# Patient Record
Sex: Female | Born: 1950 | Race: White | Hispanic: No | Marital: Married | Smoking: Never smoker
Health system: Southern US, Community
[De-identification: ages and names within clinical notes are randomized; demographics above are authoritative.]

## PROBLEM LIST (undated history)

## (undated) DIAGNOSIS — C50919 Malignant neoplasm of unspecified site of unspecified female breast: Secondary | ICD-10-CM

---

## 2017-11-07 ENCOUNTER — Other Ambulatory Visit: Payer: Self-pay

## 2017-11-07 ENCOUNTER — Encounter (HOSPITAL_COMMUNITY): Payer: Self-pay

## 2017-11-07 DIAGNOSIS — S0990XA Unspecified injury of head, initial encounter: Secondary | ICD-10-CM | POA: Diagnosis present

## 2017-11-07 DIAGNOSIS — Y999 Unspecified external cause status: Secondary | ICD-10-CM | POA: Insufficient documentation

## 2017-11-07 DIAGNOSIS — S0083XA Contusion of other part of head, initial encounter: Secondary | ICD-10-CM | POA: Insufficient documentation

## 2017-11-07 DIAGNOSIS — Y9389 Activity, other specified: Secondary | ICD-10-CM | POA: Insufficient documentation

## 2017-11-07 DIAGNOSIS — Z853 Personal history of malignant neoplasm of breast: Secondary | ICD-10-CM | POA: Insufficient documentation

## 2017-11-07 DIAGNOSIS — Y929 Unspecified place or not applicable: Secondary | ICD-10-CM | POA: Diagnosis not present

## 2017-11-07 DIAGNOSIS — Z79899 Other long term (current) drug therapy: Secondary | ICD-10-CM | POA: Diagnosis not present

## 2017-11-07 DIAGNOSIS — W101XXA Fall (on)(from) sidewalk curb, initial encounter: Secondary | ICD-10-CM | POA: Insufficient documentation

## 2017-11-07 DIAGNOSIS — Z7982 Long term (current) use of aspirin: Secondary | ICD-10-CM | POA: Diagnosis not present

## 2017-11-07 DIAGNOSIS — Z23 Encounter for immunization: Secondary | ICD-10-CM | POA: Diagnosis not present

## 2017-11-07 NOTE — ED Triage Notes (Addendum)
Fell PTA to ER no LOC hit chin and nose and bil knees and left hand voiced and bit inside of lower lip bleeding controlled. Neck pain voiced.

## 2017-11-08 ENCOUNTER — Emergency Department (HOSPITAL_COMMUNITY)
Admission: EM | Admit: 2017-11-08 | Discharge: 2017-11-08 | Disposition: A | Discharge status: Home / Self Care | Payer: Medicare Other | Attending: Emergency Medicine | Admitting: Emergency Medicine

## 2017-11-08 ENCOUNTER — Emergency Department (HOSPITAL_COMMUNITY): Payer: Medicare Other

## 2017-11-08 DIAGNOSIS — S0083XA Contusion of other part of head, initial encounter: Secondary | ICD-10-CM | POA: Diagnosis not present

## 2017-11-08 DIAGNOSIS — W19XXXA Unspecified fall, initial encounter: Secondary | ICD-10-CM

## 2017-11-08 DIAGNOSIS — T07XXXA Unspecified multiple injuries, initial encounter: Secondary | ICD-10-CM

## 2017-11-08 HISTORY — DX: Malignant neoplasm of unspecified site of unspecified female breast: C50.919

## 2017-11-08 MED ORDER — BACITRACIN ZINC 500 UNIT/GM EX OINT
TOPICAL_OINTMENT | Freq: Two times a day (BID) | CUTANEOUS | Status: DC
  Administered 2017-11-08: 1 via TOPICAL
  Filled 2017-11-08: qty 2.7

## 2017-11-08 MED ORDER — KETOROLAC TROMETHAMINE 30 MG/ML IJ SOLN
30.0000 mg | Freq: Once | INTRAMUSCULAR | Status: AC
  Administered 2017-11-08: 30 mg via INTRAMUSCULAR
  Filled 2017-11-08: qty 1

## 2017-11-08 MED ORDER — DICLOFENAC SODIUM ER 100 MG PO TB24: 100.0000 mg | ORAL_TABLET | Freq: Every day | ORAL | 0 refills | Status: AC

## 2017-11-08 MED ORDER — TETANUS-DIPHTH-ACELL PERTUSSIS 5-2.5-18.5 LF-MCG/0.5 IM SUSP
0.5000 mL | Freq: Once | INTRAMUSCULAR | Status: AC
  Administered 2017-11-08: 0.5 mL via INTRAMUSCULAR
  Filled 2017-11-08: qty 0.5

## 2017-11-08 MED ORDER — BACITRACIN ZINC 500 UNIT/GM EX OINT
TOPICAL_OINTMENT | Freq: Once | CUTANEOUS | Status: AC
  Administered 2017-11-08: 1 via TOPICAL

## 2017-11-08 NOTE — ED Provider Notes (Addendum)
[facility] DEPT Provider Note   CSN: [id number] Arrival date & time: 11/07/17  2301     History   Chief Complaint Chief Complaint  Patient presents with  . Fall    HPI Peggy Charles is a 67 y.o. female.  The history is provided by the patient.  Fall  This is a new problem. The current episode started 3 to 5 hours ago. The problem occurs constantly. The problem has not changed since onset.Pertinent negatives include no chest pain, no abdominal pain, no headaches and no shortness of breath. Nothing relieves the symptoms. She has tried nothing for the symptoms. The treatment provided no relief.  Going up curb and inverted foot and fell hitting knees on ground and nose.  No LOC.  No n/v.    Past Medical History:  Diagnosis Date  . Breast cancer ([place])     There are no active problems to display for this patient.   History reviewed. No pertinent surgical history.   OB History   None      Home Medications    Prior to Admission medications   Medication Sig Start Date End Date Taking? Authorizing Provider  anastrozole (ARIMIDEX) 1 MG tablet Take 1 mg by mouth daily. 09/14/17  Yes [provider]  aspirin EC 81 MG tablet Take 81 mg by mouth daily.   Yes [provider]  azithromycin (ZITHROMAX) 250 MG tablet Take 250-500 mg by mouth See admin instructions. 650 mg now, 250 mg x 4 days 11/03/17  Yes [provider]  Cholecalciferol (VITAMIN D) 2000 units tablet Take 2,000 Units by mouth daily.   Yes [provider]  Omega-3 Fatty Acids (FISH OIL) 1000 MG CAPS Take 1,000 mg by mouth daily.   Yes [provider]  vitamin E 400 UNIT capsule Take 400 Units by mouth daily.   Yes [provider]    Family History History reviewed. No pertinent family history.  Social History Social History   Tobacco Use  . Smoking status: Never Smoker  . Smokeless tobacco: Never Used  Substance Use Topics  .  Alcohol use: Never    Frequency: Never  . Drug use: Never     Allergies   Oxycodone and Penicillins   Review of Systems Review of Systems  HENT: Positive for facial swelling.   Respiratory: Negative for shortness of breath.   Cardiovascular: Negative for chest pain.  Gastrointestinal: Negative for abdominal pain.  Musculoskeletal: Negative for back pain, gait problem, neck pain and neck stiffness.  Neurological: Negative for dizziness, tremors, seizures, syncope, facial asymmetry, speech difficulty, weakness, light-headedness, numbness and headaches.  Hematological: Does not bruise/bleed easily.  All other systems reviewed and are negative.    Physical Exam Updated Vital Signs BP (!) 168/75 (BP Location: Right Arm)   Pulse 77   Temp 98.8 F (37.1 C) (Oral)   Resp 16   Ht _0  (1.702 m)   Wt 77.1 kg (170 lb)   SpO2 99%   BMI 26.63 kg/m   Physical Exam  Constitutional: She is oriented to person, place, and time. She appears well-developed and well-nourished.  HENT:  Head: Normocephalic.    Right Ear: No mastoid tenderness. No hemotympanum.  Left Ear: No mastoid tenderness. No hemotympanum.  Mouth/Throat: No oropharyngeal exudate.    Eyes: Pupils are equal, round, and reactive to light. Conjunctivae and EOM are normal.  Neck: Normal range of motion. Neck supple. No tracheal deviation present.  Cardiovascular: Normal rate,  regular rhythm, normal heart sounds and intact distal pulses.  Pulmonary/Chest: Effort normal and breath sounds normal. No stridor. She has no wheezes. She has no rales.  Abdominal: Soft. Bowel sounds are normal. She exhibits no mass. There is no tenderness. There is no rebound and no guarding.  Musculoskeletal: Normal range of motion.       Right shoulder: Normal.       Left shoulder: Normal.       Left elbow: Normal.       Right wrist: Normal.       Left wrist: Normal.       Right knee: Normal.       Left knee: Normal.       Right ankle:  Normal. Achilles tendon normal.       Left ankle: Normal. Achilles tendon normal.       Cervical back: Normal.       Thoracic back: Normal.       Lumbar back: Normal.       Left forearm: Normal.       Left hand: She exhibits normal range of motion, normal capillary refill, no deformity and no laceration. Normal sensation noted. Normal strength noted.       Hands: Neurological: She is alert and oriented to person, place, and time. She displays normal reflexes. Coordination normal.  Skin: Skin is warm and dry. Capillary refill takes less than 2 seconds.     Psychiatric: She has a normal mood and affect.     ED Treatments / Results  Labs (all labs ordered are listed, but only abnormal results are displayed) Labs Reviewed - No data to display  EKG None  Radiology Dg Wrist Complete Left  Result Date: 11/08/2017 CLINICAL DATA:  Left wrist pain after fall. EXAM: LEFT WRIST - COMPLETE 3+ VIEW COMPARISON:  None. FINDINGS: There is no evidence of fracture or dislocation. Small sclerotic focus involving the styloid of the radius compatible with a bone island. There is no evidence of arthropathy or other focal bone abnormality. Soft tissues are unremarkable. IMPRESSION: No acute fracture or malalignment of the left wrist. Electronically Signed   By: Ashley Royalty M.D.   On: 11/08/2017 02:03   Ct Head Wo Contrast  Result Date: 11/08/2017 CLINICAL DATA:  67 y/o  F; fall with injury to the chin and nose. EXAM: CT HEAD WITHOUT CONTRAST CT MAXILLOFACIAL WITHOUT CONTRAST CT CERVICAL SPINE WITHOUT CONTRAST TECHNIQUE: Multidetector CT imaging of the head, cervical spine, and maxillofacial structures were performed using the standard protocol without intravenous contrast. Multiplanar CT image reconstructions of the cervical spine and maxillofacial structures were also generated. COMPARISON:  None. FINDINGS: CT HEAD FINDINGS Brain: No evidence of acute infarction, hemorrhage, hydrocephalus, extra-axial  collection or mass lesion/mass effect. Vascular: No hyperdense vessel or unexpected calcification. Skull: Normal. Negative for fracture or focal lesion. Other: None. CT MAXILLOFACIAL FINDINGS Osseous: No fracture or mandibular dislocation. No destructive process. Orbits: Negative. No traumatic or inflammatory finding. Sinuses: Clear. Soft tissues: Mild soft tissue swelling overlying the chin of the nose. CT CERVICAL SPINE FINDINGS Alignment: Straightening of cervical lordosis without listhesis. Skull base and vertebrae: No acute fracture. No primary bone lesion or focal pathologic process. Soft tissues and spinal canal: No prevertebral fluid or swelling. No visible canal hematoma. Disc levels: Moderate cervical spondylosis with multilevel disc and facet degenerative changes. No high-grade bony canal stenosis. Upper chest: Negative. Other: Negative. IMPRESSION: 1. No acute intracranial abnormality or calvarial fracture. 2. No acute facial fracture  or mandibular dislocation. 3. Soft tissue swelling overlying the chin and nose. 4. No acute fracture or dislocation of the cervical spine. 5. Moderate cervical spondylosis. No high-grade bony canal stenosis. Electronically Signed   By: Kristine Garbe M.D.   On: 11/08/2017 02:27   Ct Cervical Spine Wo Contrast  Result Date: 11/08/2017 CLINICAL DATA:  67 y/o  F; fall with injury to the chin and nose. EXAM: CT HEAD WITHOUT CONTRAST CT MAXILLOFACIAL WITHOUT CONTRAST CT CERVICAL SPINE WITHOUT CONTRAST TECHNIQUE: Multidetector CT imaging of the head, cervical spine, and maxillofacial structures were performed using the standard protocol without intravenous contrast. Multiplanar CT image reconstructions of the cervical spine and maxillofacial structures were also generated. COMPARISON:  None. FINDINGS: CT HEAD FINDINGS Brain: No evidence of acute infarction, hemorrhage, hydrocephalus, extra-axial collection or mass lesion/mass effect. Vascular: No hyperdense vessel or  unexpected calcification. Skull: Normal. Negative for fracture or focal lesion. Other: None. CT MAXILLOFACIAL FINDINGS Osseous: No fracture or mandibular dislocation. No destructive process. Orbits: Negative. No traumatic or inflammatory finding. Sinuses: Clear. Soft tissues: Mild soft tissue swelling overlying the chin of the nose. CT CERVICAL SPINE FINDINGS Alignment: Straightening of cervical lordosis without listhesis. Skull base and vertebrae: No acute fracture. No primary bone lesion or focal pathologic process. Soft tissues and spinal canal: No prevertebral fluid or swelling. No visible canal hematoma. Disc levels: Moderate cervical spondylosis with multilevel disc and facet degenerative changes. No high-grade bony canal stenosis. Upper chest: Negative. Other: Negative. IMPRESSION: 1. No acute intracranial abnormality or calvarial fracture. 2. No acute facial fracture or mandibular dislocation. 3. Soft tissue swelling overlying the chin and nose. 4. No acute fracture or dislocation of the cervical spine. 5. Moderate cervical spondylosis. No high-grade bony canal stenosis. Electronically Signed   By: Kristine Garbe M.D.   On: 11/08/2017 02:27   Dg Hand Complete Left  Result Date: 11/08/2017 CLINICAL DATA:  Left hand pain after slip and fall. EXAM: LEFT HAND - COMPLETE 3+ VIEW COMPARISON:  None. FINDINGS: There is no evidence of fracture or dislocation. Mild osteoarthritic joint space narrowing of the PIP and DIP joints of the fifth digit. Carpal rows are intact. Distal radius and ulna are unremarkable. Soft tissues are unremarkable. IMPRESSION: No acute fracture or malalignment. 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 Electronically Signed   By: Ashley Royalty M.D.   On: 11/08/2017 01:57   Ct Maxillofacial Wo Contrast  Result Date: 11/08/2017 CLINICAL DATA:  67 y/o  F; fall with injury to the chin and nose. EXAM: CT HEAD WITHOUT CONTRAST CT MAXILLOFACIAL WITHOUT  CONTRAST CT CERVICAL SPINE WITHOUT CONTRAST TECHNIQUE: Multidetector CT imaging of the head, cervical spine, and maxillofacial structures were performed using the standard protocol without intravenous contrast. Multiplanar CT image reconstructions of the cervical spine and maxillofacial structures were also generated. COMPARISON:  None. FINDINGS: CT HEAD FINDINGS Brain: No evidence of acute infarction, hemorrhage, hydrocephalus, extra-axial collection or mass lesion/mass effect. Vascular: No hyperdense vessel or unexpected calcification. Skull: Normal. Negative for fracture or focal lesion. Other: None. CT MAXILLOFACIAL FINDINGS Osseous: No fracture or mandibular dislocation. No destructive process. Orbits: Negative. No traumatic or inflammatory finding. Sinuses: Clear. Soft tissues: Mild soft tissue swelling overlying the chin of the nose. CT CERVICAL SPINE FINDINGS Alignment: Straightening of cervical lordosis without listhesis. Skull base and vertebrae: No acute fracture. No primary bone lesion or focal pathologic process. Soft tissues and spinal canal: No prevertebral fluid or swelling. No visible canal hematoma. Disc levels:  Moderate cervical spondylosis with multilevel disc and facet degenerative changes. No high-grade bony canal stenosis. Upper chest: Negative. Other: Negative. IMPRESSION: 1. No acute intracranial abnormality or calvarial fracture. 2. No acute facial fracture or mandibular dislocation. 3. Soft tissue swelling overlying the chin and nose. 4. No acute fracture or dislocation of the cervical spine. 5. Moderate cervical spondylosis. No high-grade bony canal stenosis. Electronically Signed   By: Kristine Garbe M.D.   On: 11/08/2017 02:27    Procedures Procedures (including critical care time)  Medications Ordered in ED Medications  bacitracin ointment (has no administration in time range)  ketorolac (TORADOL) 30 MG/ML injection 30 mg (has no administration in time range)  Tdap  (BOOSTRIX) injection 0.5 mL (has no administration in time range)  bacitracin ointment (has no administration in time range)      Final Clinical Impressions(s) / ED Diagnoses   Return for weakness, numbness, changes in vision or speech, fevers >100.4 unrelieved by medication, shortness of breath, intractable vomiting, or diarrhea, abdominal pain, Inability to tolerate liquids or food, cough, altered mental status or any concerns. No signs of systemic illness or infection. The patient is nontoxic-appearing on exam and vital signs are within normal limits.   I have reviewed the triage vital signs and the nursing notes. Pertinent labs &imaging results that were available during my care of the patient were reviewed by me and considered in my medical decision making (see chart for details).  After history, exam, and medical workup I feel the patient has been appropriately medically screened and is safe for discharge home. Pertinent diagnoses were discussed with the patient. Patient was given return precautions.    Shonta Phillis, MD 11/08/17 [place], Wilmer [place], MD 11/08/17 [id number]

## 2017-11-08 NOTE — ED Notes (Signed)
Patient ambulatory to room with steady gait.

## 2019-09-16 IMAGING — CT CT CERVICAL SPINE W/O CM
4 of 10 series · 8 of 34 positions shown, 9 images · non-contrast
Comparison: None.

CLINICAL DATA: 66 y/o  F; fall with injury to the chin and nose.

EXAM:
CT HEAD WITHOUT CONTRAST
CT MAXILLOFACIAL WITHOUT CONTRAST
CT CERVICAL SPINE WITHOUT CONTRAST
TECHNIQUE: Multidetector CT imaging of the head, cervical spine, and
maxillofacial structures were performed using the standard protocol
without intravenous contrast. Multiplanar CT image reconstructions
of the cervical spine and maxillofacial structures were also
generated.

[Series 8: c spine soft · axial · 0.30mm/px · z∈[+1458,+1512]mm · 2 of 82 slices shown]
[im 28/82  soft-tissue]
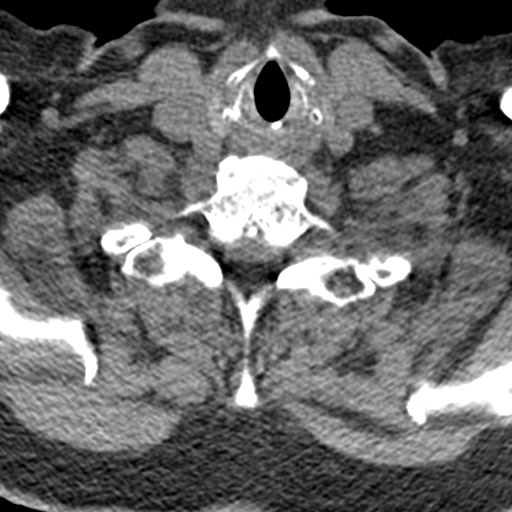
[im 55/82  soft-tissue]
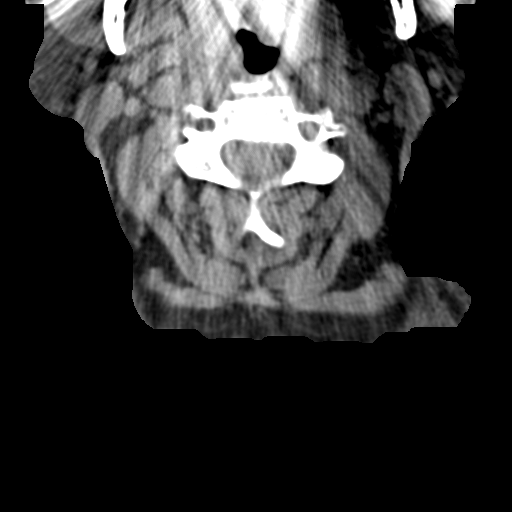

[Series 9: orthogonal axials · axial · 0.18mm/px · z∈[+1450,+1501]mm · 2 of 86 slices shown, 3 images]
[im 29/86  soft-tissue]
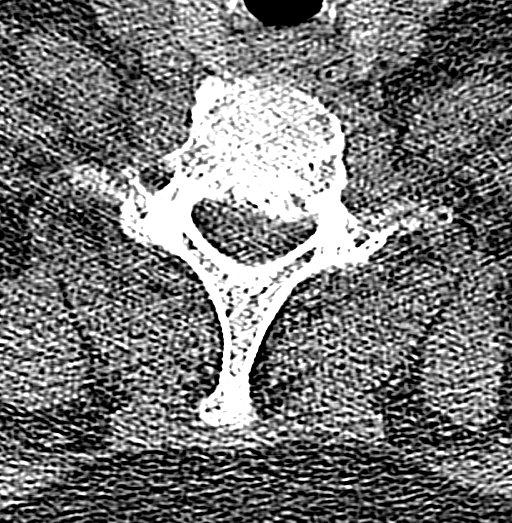
[im 29/86  bone]
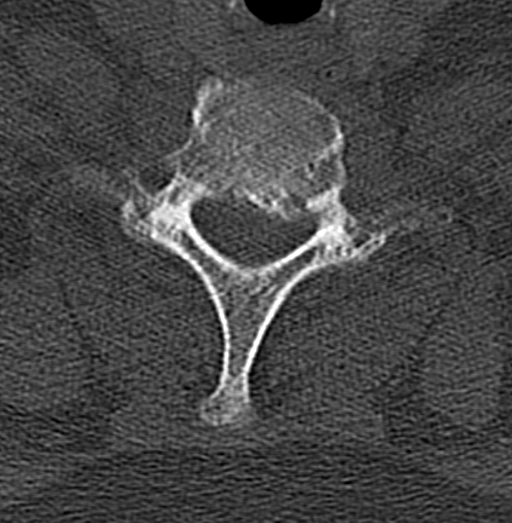
[im 57/86  bone]
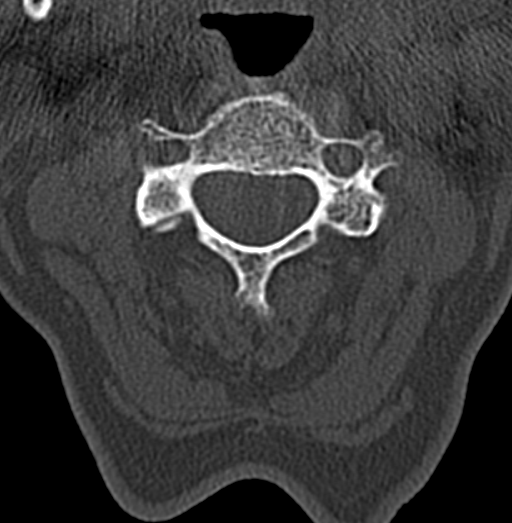

[Series 11: sagittal bone · sagittal · 0.19mm/px · 2 of 47 slices shown]
[im 16/47  bone]
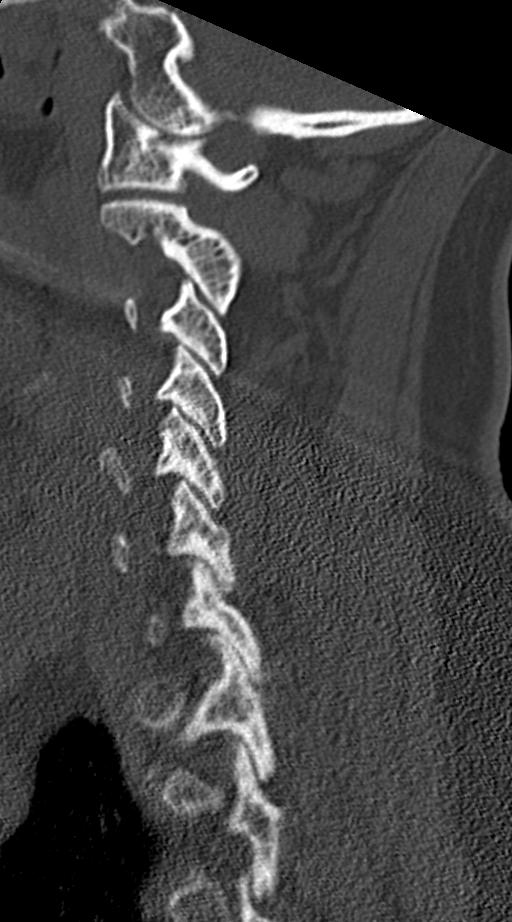
[im 31/47  bone]
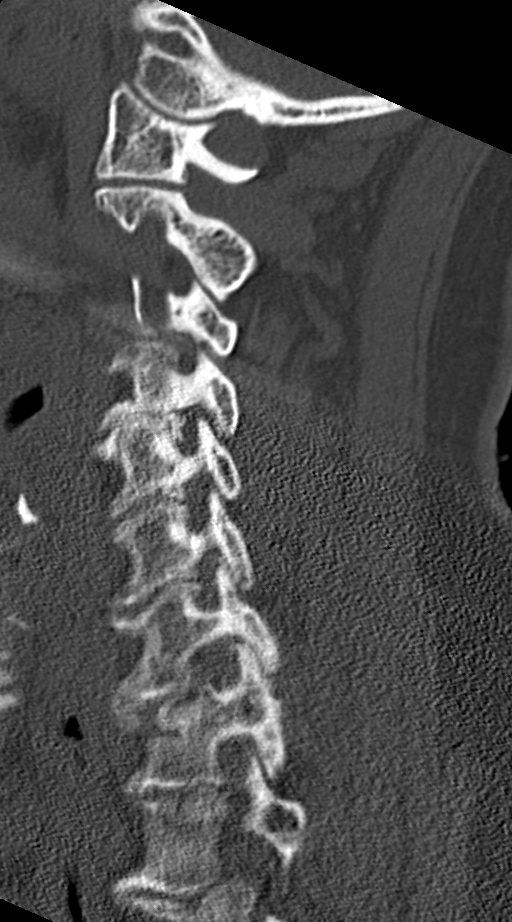

[Series 13: max soft · axial · 0.37mm/px · z∈[+1502,+1552]mm · 2 of 75 slices shown]
[im 25/75  soft-tissue]
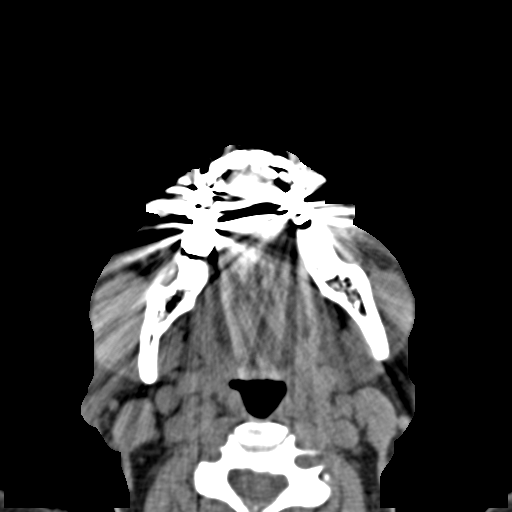
[im 50/75  soft-tissue]
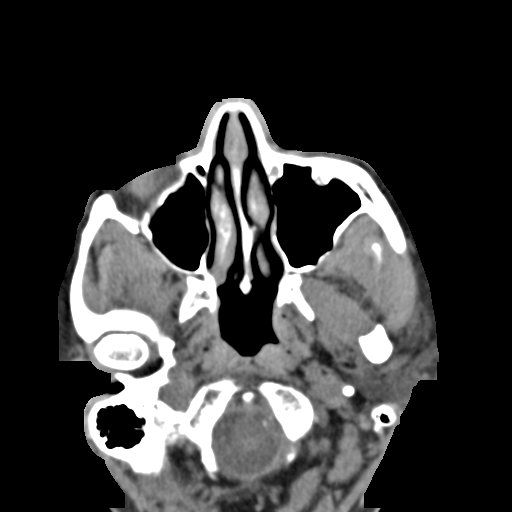

[8 of 34 positions shown; findings below may reference images not displayed]

FINDINGS: CT HEAD FINDINGS

Brain: No evidence of acute infarction, hemorrhage, hydrocephalus,
extra-axial collection or mass lesion/mass effect.

Vascular: No hyperdense vessel or unexpected calcification.

Skull: Normal. Negative for fracture or focal lesion.

Other: None.

CT MAXILLOFACIAL FINDINGS

Osseous: No fracture or mandibular dislocation. No destructive
process.

Orbits: Negative. No traumatic or inflammatory finding.

Sinuses: Clear.

Soft tissues: Mild soft tissue swelling overlying the chin of the
nose.

CT CERVICAL SPINE FINDINGS

Alignment: Straightening of cervical lordosis without listhesis.

Skull base and vertebrae: No acute fracture. No primary bone lesion
or focal pathologic process.

Soft tissues and spinal canal: No prevertebral fluid or swelling. No
visible canal hematoma.

Disc levels: Moderate cervical spondylosis with multilevel disc and
facet degenerative changes. No high-grade bony canal stenosis.

Upper chest: Negative.

Other: Negative.
IMPRESSION: 1. No acute intracranial abnormality or calvarial fracture.
2. No acute facial fracture or mandibular dislocation.
3. Soft tissue swelling overlying the chin and nose.
4. No acute fracture or dislocation of the cervical spine.
5. Moderate cervical spondylosis. No high-grade bony canal stenosis.

By: Andreasfaidwn Iugulescu M.D.
